# Patient Record
Sex: Female | Born: 1945 | Race: White | Hispanic: No | Marital: Married | Smoking: Former smoker
Health system: Southern US, Community
[De-identification: ages and names within clinical notes are randomized; demographics above are authoritative.]

## PROBLEM LIST (undated history)

## (undated) DIAGNOSIS — S62667A Nondisplaced fracture of distal phalanx of left little finger, initial encounter for closed fracture: Secondary | ICD-10-CM

## (undated) DIAGNOSIS — S93325A Dislocation of tarsometatarsal joint of left foot, initial encounter: Secondary | ICD-10-CM

## (undated) DIAGNOSIS — I10 Essential (primary) hypertension: Secondary | ICD-10-CM

## (undated) DIAGNOSIS — C679 Malignant neoplasm of bladder, unspecified: Secondary | ICD-10-CM

## (undated) DIAGNOSIS — Z9289 Personal history of other medical treatment: Secondary | ICD-10-CM

## (undated) DIAGNOSIS — S92902A Unspecified fracture of left foot, initial encounter for closed fracture: Secondary | ICD-10-CM

## (undated) DIAGNOSIS — M810 Age-related osteoporosis without current pathological fracture: Secondary | ICD-10-CM

## (undated) DIAGNOSIS — F419 Anxiety disorder, unspecified: Secondary | ICD-10-CM

## (undated) DIAGNOSIS — E7800 Pure hypercholesterolemia, unspecified: Secondary | ICD-10-CM

## (undated) HISTORY — PX: APPENDECTOMY: SHX54

## (undated) HISTORY — PX: TRANSURETHRAL RESECTION OF BLADDER TUMOR: SHX2575

## (undated) HISTORY — PX: FRACTURE SURGERY: SHX138

## (undated) HISTORY — PX: RIGHT OOPHORECTOMY: SHX2359

## (undated) HISTORY — PX: ABDOMINAL HYSTERECTOMY: SHX81

---

## 2006-04-25 HISTORY — PX: ELBOW FRACTURE SURGERY: SHX616

## 2013-04-25 HISTORY — PX: FOOT FRACTURE SURGERY: SHX645

## 2015-07-25 HISTORY — PX: FIBULA FRACTURE SURGERY: SHX947

## 2017-04-25 DIAGNOSIS — Z9289 Personal history of other medical treatment: Secondary | ICD-10-CM

## 2017-04-25 HISTORY — DX: Personal history of other medical treatment: Z92.89

## 2017-05-08 ENCOUNTER — Emergency Department (HOSPITAL_COMMUNITY): Payer: Medicare Other

## 2017-05-08 ENCOUNTER — Inpatient Hospital Stay (HOSPITAL_COMMUNITY)
Admission: EM | Admit: 2017-05-08 | Discharge: 2017-05-22 | DRG: 460 | Disposition: A | Discharge status: Home Health | Payer: Medicare Other | Attending: General Surgery | Admitting: General Surgery

## 2017-05-08 ENCOUNTER — Encounter (HOSPITAL_COMMUNITY): Payer: Self-pay | Admitting: Emergency Medicine

## 2017-05-08 DIAGNOSIS — E8889 Other specified metabolic disorders: Secondary | ICD-10-CM | POA: Diagnosis present

## 2017-05-08 DIAGNOSIS — S62637A Displaced fracture of distal phalanx of left little finger, initial encounter for closed fracture: Secondary | ICD-10-CM | POA: Diagnosis present

## 2017-05-08 DIAGNOSIS — S32018A Other fracture of first lumbar vertebra, initial encounter for closed fracture: Principal | ICD-10-CM | POA: Diagnosis present

## 2017-05-08 DIAGNOSIS — S93325A Dislocation of tarsometatarsal joint of left foot, initial encounter: Secondary | ICD-10-CM | POA: Diagnosis present

## 2017-05-08 DIAGNOSIS — S62667A Nondisplaced fracture of distal phalanx of left little finger, initial encounter for closed fracture: Secondary | ICD-10-CM

## 2017-05-08 DIAGNOSIS — S92332A Displaced fracture of third metatarsal bone, left foot, initial encounter for closed fracture: Secondary | ICD-10-CM | POA: Diagnosis present

## 2017-05-08 DIAGNOSIS — S36039A Unspecified laceration of spleen, initial encounter: Secondary | ICD-10-CM | POA: Diagnosis present

## 2017-05-08 DIAGNOSIS — S92342A Displaced fracture of fourth metatarsal bone, left foot, initial encounter for closed fracture: Secondary | ICD-10-CM | POA: Diagnosis present

## 2017-05-08 DIAGNOSIS — S92352A Displaced fracture of fifth metatarsal bone, left foot, initial encounter for closed fracture: Secondary | ICD-10-CM | POA: Diagnosis present

## 2017-05-08 DIAGNOSIS — F419 Anxiety disorder, unspecified: Secondary | ICD-10-CM | POA: Diagnosis present

## 2017-05-08 DIAGNOSIS — K661 Hemoperitoneum: Secondary | ICD-10-CM

## 2017-05-08 DIAGNOSIS — E876 Hypokalemia: Secondary | ICD-10-CM | POA: Diagnosis present

## 2017-05-08 DIAGNOSIS — M810 Age-related osteoporosis without current pathological fracture: Secondary | ICD-10-CM | POA: Diagnosis present

## 2017-05-08 DIAGNOSIS — S92322A Displaced fracture of second metatarsal bone, left foot, initial encounter for closed fracture: Secondary | ICD-10-CM | POA: Diagnosis present

## 2017-05-08 DIAGNOSIS — J9811 Atelectasis: Secondary | ICD-10-CM | POA: Diagnosis present

## 2017-05-08 DIAGNOSIS — R200 Anesthesia of skin: Secondary | ICD-10-CM | POA: Diagnosis present

## 2017-05-08 DIAGNOSIS — S92312A Displaced fracture of first metatarsal bone, left foot, initial encounter for closed fracture: Secondary | ICD-10-CM | POA: Diagnosis present

## 2017-05-08 DIAGNOSIS — S36030A Superficial (capsular) laceration of spleen, initial encounter: Secondary | ICD-10-CM | POA: Diagnosis present

## 2017-05-08 DIAGNOSIS — R339 Retention of urine, unspecified: Secondary | ICD-10-CM | POA: Diagnosis present

## 2017-05-08 DIAGNOSIS — S92812A Other fracture of left foot, initial encounter for closed fracture: Secondary | ICD-10-CM

## 2017-05-08 DIAGNOSIS — Z79899 Other long term (current) drug therapy: Secondary | ICD-10-CM

## 2017-05-08 DIAGNOSIS — S2220XA Unspecified fracture of sternum, initial encounter for closed fracture: Secondary | ICD-10-CM | POA: Diagnosis present

## 2017-05-08 DIAGNOSIS — I10 Essential (primary) hypertension: Secondary | ICD-10-CM | POA: Diagnosis present

## 2017-05-08 DIAGNOSIS — S2243XA Multiple fractures of ribs, bilateral, initial encounter for closed fracture: Secondary | ICD-10-CM | POA: Diagnosis present

## 2017-05-08 DIAGNOSIS — M79673 Pain in unspecified foot: Secondary | ICD-10-CM

## 2017-05-08 DIAGNOSIS — S36892A Contusion of other intra-abdominal organs, initial encounter: Secondary | ICD-10-CM | POA: Diagnosis present

## 2017-05-08 DIAGNOSIS — R6889 Other general symptoms and signs: Secondary | ICD-10-CM

## 2017-05-08 DIAGNOSIS — Z87891 Personal history of nicotine dependence: Secondary | ICD-10-CM

## 2017-05-08 DIAGNOSIS — M79642 Pain in left hand: Secondary | ICD-10-CM | POA: Diagnosis present

## 2017-05-08 DIAGNOSIS — Z7983 Long term (current) use of bisphosphonates: Secondary | ICD-10-CM

## 2017-05-08 DIAGNOSIS — S36114A Minor laceration of liver, initial encounter: Secondary | ICD-10-CM | POA: Diagnosis present

## 2017-05-08 DIAGNOSIS — Y92410 Unspecified street and highway as the place of occurrence of the external cause: Secondary | ICD-10-CM

## 2017-05-08 DIAGNOSIS — S2242XA Multiple fractures of ribs, left side, initial encounter for closed fracture: Secondary | ICD-10-CM

## 2017-05-08 DIAGNOSIS — S32001A Stable burst fracture of unspecified lumbar vertebra, initial encounter for closed fracture: Secondary | ICD-10-CM | POA: Diagnosis present

## 2017-05-08 DIAGNOSIS — D62 Acute posthemorrhagic anemia: Secondary | ICD-10-CM | POA: Diagnosis not present

## 2017-05-08 DIAGNOSIS — S299XXA Unspecified injury of thorax, initial encounter: Secondary | ICD-10-CM

## 2017-05-08 DIAGNOSIS — M549 Dorsalgia, unspecified: Secondary | ICD-10-CM | POA: Diagnosis present

## 2017-05-08 DIAGNOSIS — T148XXA Other injury of unspecified body region, initial encounter: Secondary | ICD-10-CM

## 2017-05-08 DIAGNOSIS — S92902A Unspecified fracture of left foot, initial encounter for closed fracture: Secondary | ICD-10-CM | POA: Diagnosis present

## 2017-05-08 DIAGNOSIS — K5900 Constipation, unspecified: Secondary | ICD-10-CM | POA: Diagnosis not present

## 2017-05-08 DIAGNOSIS — Z419 Encounter for procedure for purposes other than remedying health state, unspecified: Secondary | ICD-10-CM

## 2017-05-08 HISTORY — DX: Unspecified fracture of left foot, initial encounter for closed fracture: S92.902A

## 2017-05-08 HISTORY — DX: Dislocation of tarsometatarsal joint of left foot, initial encounter: S93.325A

## 2017-05-08 HISTORY — DX: Personal history of other medical treatment: Z92.89

## 2017-05-08 HISTORY — DX: Anxiety disorder, unspecified: F41.9

## 2017-05-08 HISTORY — DX: Nondisplaced fracture of distal phalanx of left little finger, initial encounter for closed fracture: S62.667A

## 2017-05-08 HISTORY — DX: Malignant neoplasm of bladder, unspecified: C67.9

## 2017-05-08 HISTORY — DX: Age-related osteoporosis without current pathological fracture: M81.0

## 2017-05-08 HISTORY — DX: Pure hypercholesterolemia, unspecified: E78.00

## 2017-05-08 HISTORY — DX: Essential (primary) hypertension: I10

## 2017-05-08 LAB — ABO/RH: ABO/RH(D): AB POS

## 2017-05-08 LAB — COMPREHENSIVE METABOLIC PANEL
ALBUMIN: 3.3 g/dL — AB (ref 3.5–5.0)
ALT: 73 U/L — AB (ref 14–54)
ANION GAP: 12 (ref 5–15)
AST: 92 U/L — ABNORMAL HIGH (ref 15–41)
Alkaline Phosphatase: 56 U/L (ref 38–126)
BUN: 30 mg/dL — ABNORMAL HIGH (ref 6–20)
CHLORIDE: 101 mmol/L (ref 101–111)
CO2: 25 mmol/L (ref 22–32)
CREATININE: 1.05 mg/dL — AB (ref 0.44–1.00)
Calcium: 9.3 mg/dL (ref 8.9–10.3)
GFR calc non Af Amer: 52 mL/min — ABNORMAL LOW (ref >60)
GLUCOSE: 149 mg/dL — AB (ref 65–99)
Potassium: 3.2 mmol/L — ABNORMAL LOW (ref 3.5–5.1)
SODIUM: 138 mmol/L (ref 135–145)
Total Bilirubin: 0.8 mg/dL (ref 0.3–1.2)
Total Protein: 6.2 g/dL — ABNORMAL LOW (ref 6.5–8.1)

## 2017-05-08 LAB — CBC
HCT: 39.3 % (ref 36.0–46.0)
Hemoglobin: 13.4 g/dL (ref 12.0–15.0)
MCH: 30.8 pg (ref 26.0–34.0)
MCHC: 34.1 g/dL (ref 30.0–36.0)
MCV: 90.3 fL (ref 78.0–100.0)
Platelets: 232 10*3/uL (ref 150–400)
RBC: 4.35 MIL/uL (ref 3.87–5.11)
RDW: 13.1 % (ref 11.5–15.5)
WBC: 15.5 10*3/uL — ABNORMAL HIGH (ref 4.0–10.5)

## 2017-05-08 LAB — I-STAT TROPONIN, ED: TROPONIN I, POC: 0.01 ng/mL (ref 0.00–0.08)

## 2017-05-08 LAB — I-STAT CG4 LACTIC ACID, ED: Lactic Acid, Venous: 2.88 mmol/L (ref 0.5–1.9)

## 2017-05-08 LAB — PROTIME-INR
INR: 0.99
PROTHROMBIN TIME: 13 s (ref 11.4–15.2)

## 2017-05-08 LAB — I-STAT CHEM 8, ED
BUN: 34 mg/dL — ABNORMAL HIGH (ref 6–20)
CHLORIDE: 100 mmol/L — AB (ref 101–111)
Calcium, Ion: 1.22 mmol/L (ref 1.15–1.40)
Creatinine, Ser: 1.1 mg/dL — ABNORMAL HIGH (ref 0.44–1.00)
GLUCOSE: 150 mg/dL — AB (ref 65–99)
HCT: 39 % (ref 36.0–46.0)
HEMOGLOBIN: 13.3 g/dL (ref 12.0–15.0)
POTASSIUM: 3.4 mmol/L — AB (ref 3.5–5.1)
SODIUM: 143 mmol/L (ref 135–145)
TCO2: 29 mmol/L (ref 22–32)

## 2017-05-08 LAB — PREPARE RBC (CROSSMATCH)

## 2017-05-08 LAB — ETHANOL: Alcohol, Ethyl (B): <10 mg/dL (ref <10)

## 2017-05-08 MED ORDER — ONDANSETRON HCL 4 MG/2ML IJ SOLN
4.0000 mg | Freq: Once | INTRAMUSCULAR | Status: AC
  Administered 2017-05-08: 4 mg via INTRAVENOUS
  Filled 2017-05-08: qty 2

## 2017-05-08 MED ORDER — LORAZEPAM 2 MG/ML IJ SOLN
0.5000 mg | Freq: Once | INTRAMUSCULAR | Status: AC
  Administered 2017-05-09: 0.5 mg via INTRAVENOUS
  Filled 2017-05-08: qty 1

## 2017-05-08 MED ORDER — SODIUM CHLORIDE 0.9 % IV BOLUS (SEPSIS)
1000.0000 mL | Freq: Once | INTRAVENOUS | Status: AC
  Administered 2017-05-08: 1000 mL via INTRAVENOUS

## 2017-05-08 MED ORDER — MORPHINE SULFATE (PF) 4 MG/ML IV SOLN
1.0000 mg | INTRAVENOUS | Status: DC | PRN
  Administered 2017-05-09 – 2017-05-10 (×4): 1 mg via INTRAVENOUS
  Filled 2017-05-08 (×4): qty 1

## 2017-05-08 MED ORDER — SODIUM CHLORIDE 0.9 % IV SOLN
10.0000 mL/h | Freq: Once | INTRAVENOUS | Status: AC
  Administered 2017-05-08: 10 mL/h via INTRAVENOUS

## 2017-05-08 MED ORDER — ONDANSETRON 4 MG PO TBDP: 4.0000 mg | ORAL_TABLET | Freq: Four times a day (QID) | ORAL | Status: DC | PRN

## 2017-05-08 MED ORDER — KETAMINE HCL-SODIUM CHLORIDE 100-0.9 MG/10ML-% IV SOSY
PREFILLED_SYRINGE | INTRAVENOUS | Status: AC
  Administered 2017-05-08: 60 mg
  Filled 2017-05-08: qty 10

## 2017-05-08 MED ORDER — ONDANSETRON HCL 4 MG/2ML IJ SOLN
4.0000 mg | Freq: Once | INTRAMUSCULAR | Status: AC
  Administered 2017-05-08: 4 mg via INTRAVENOUS

## 2017-05-08 MED ORDER — DEXTROSE-NACL 5-0.9 % IV SOLN
INTRAVENOUS | Status: DC
  Administered 2017-05-08: 23:00:00 via INTRAVENOUS

## 2017-05-08 MED ORDER — IOPAMIDOL (ISOVUE-300) INJECTION 61%
INTRAVENOUS | Status: AC
  Administered 2017-05-08: 100 mL
  Filled 2017-05-08: qty 100

## 2017-05-08 MED ORDER — HYDRALAZINE HCL 20 MG/ML IJ SOLN: 10.0000 mg | INTRAMUSCULAR | Status: DC | PRN

## 2017-05-08 MED ORDER — ONDANSETRON HCL 4 MG/2ML IJ SOLN
INTRAMUSCULAR | Status: AC
  Filled 2017-05-08: qty 2

## 2017-05-08 MED ORDER — ONDANSETRON HCL 4 MG/2ML IJ SOLN: 4.0000 mg | Freq: Four times a day (QID) | INTRAMUSCULAR | Status: DC | PRN

## 2017-05-08 MED ORDER — FENTANYL CITRATE (PF) 100 MCG/2ML IJ SOLN
50.0000 ug | INTRAMUSCULAR | Status: DC | PRN
  Administered 2017-05-08 – 2017-05-09 (×5): 50 ug via INTRAVENOUS
  Filled 2017-05-08 (×5): qty 2
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
## 2017-05-08 NOTE — ED Notes (Signed)
[facility] called and unable to give update on why sample has not resulted from an hour ago.

## 2017-05-08 NOTE — ED Notes (Signed)
Patient placed on PureWick female external catheter. 

## 2017-05-08 NOTE — Consult Note (Signed)
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 second metatarsal distal diaphysis fracture. Electronically Signed   By: Kristine Garbe M.D.   On: 05/08/2017 20:08    ROS  ROS: I have reviewed the patient's review of systems thoroughly and there are no positive responses as relates to the HPI. Blood pressure (!) 97/48, pulse 81, temperature 98.1 F (36.7 C), temperature source Oral, resp. rate (!) 25, height 5' 5"  (1.651 m), weight 59 kg (130 lb), SpO2 96 %. Physical Exam Well-developed well-nourished patient in no acute distress. Alert and oriented x3 HEENT:within normal limits Cardiac: Regular rate and rhythm Pulmonary: Lungs clear to auscultation Abdomen: Soft and nontender.  Normal active bowel sounds  Musculoskeletal: (left hand has significant ecchymosis over the fifth metacarpal phalangeal joint area.  Left little finger is minimally tender to palpation around the DIP joint left little finger.  Left foot has obvious and significant soft tissue swelling with mild pain to range of motion. Assessment/Plan: 72 year old female with multiple traumatic injuries who suffered fracture dislocation of her foot which was reduced by the emergency room physician.   She has a Lisfranc fracture dislocation as well as an avulsion from the anterior aspect of the tibia and ultimately will need reduction and fixation.  She has a minimally displaced fracture of the distal phalanx of the left little finger.  This has minimal tenderness.//I came into the emergency room to evaluate the patient and given her significant traumatic injuries I think we need to sort out what she is going to need to be done.  Specifically I think we need to know what she can have done for her lumbar spine fracture.  Ultimately I will likely hand her case off to the trauma service given that the timing of surgical intervention is unknown.  I have put a Xeroform dressing over her foot in case she has significant swelling and I put a compressive wrap around the foot to help control for swelling.  She will have this elevated and iced to help with swelling and fluid skin compromise.  Emily Brewer 05/08/2017, 11:03 PM
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
## 2017-05-08 NOTE — ED Notes (Signed)
Family at beside. Family given emotional support. 

## 2017-05-08 NOTE — ED Notes (Signed)
Pt vomiting.

## 2017-05-08 NOTE — ED Notes (Signed)
Neuro at the bedside.
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
## 2017-05-08 NOTE — ED Notes (Signed)
Family updated as to patient's status.

## 2017-05-09 ENCOUNTER — Inpatient Hospital Stay (HOSPITAL_COMMUNITY): Payer: Medicare Other

## 2017-05-09 LAB — COMPREHENSIVE METABOLIC PANEL
ALBUMIN: 2.9 g/dL — AB (ref 3.5–5.0)
ALK PHOS: 38 U/L (ref 38–126)
ALT: 68 U/L — ABNORMAL HIGH (ref 14–54)
AST: 104 U/L — ABNORMAL HIGH (ref 15–41)
Anion gap: 11 (ref 5–15)
BILIRUBIN TOTAL: 0.9 mg/dL (ref 0.3–1.2)
BUN: 24 mg/dL — ABNORMAL HIGH (ref 6–20)
CALCIUM: 7.8 mg/dL — AB (ref 8.9–10.3)
CO2: 17 mmol/L — ABNORMAL LOW (ref 22–32)
Chloride: 112 mmol/L — ABNORMAL HIGH (ref 101–111)
Creatinine, Ser: 0.82 mg/dL (ref 0.44–1.00)
GFR calc Af Amer: >60 mL/min (ref >60)
GFR calc non Af Amer: >60 mL/min (ref >60)
GLUCOSE: 188 mg/dL — AB (ref 65–99)
Potassium: 3.2 mmol/L — ABNORMAL LOW (ref 3.5–5.1)
Sodium: 140 mmol/L (ref 135–145)
TOTAL PROTEIN: 5.1 g/dL — AB (ref 6.5–8.1)

## 2017-05-09 LAB — CBC
HEMATOCRIT: 35.7 % — AB (ref 36.0–46.0)
Hemoglobin: 12.1 g/dL (ref 12.0–15.0)
MCH: 30.3 pg (ref 26.0–34.0)
MCHC: 33.9 g/dL (ref 30.0–36.0)
MCV: 89.5 fL (ref 78.0–100.0)
Platelets: 125 10*3/uL — ABNORMAL LOW (ref 150–400)
RBC: 3.99 MIL/uL (ref 3.87–5.11)
RDW: 14.1 % (ref 11.5–15.5)
WBC: 10.8 10*3/uL — ABNORMAL HIGH (ref 4.0–10.5)

## 2017-05-09 LAB — SAMPLE TO BLOOD BANK

## 2017-05-09 LAB — URINALYSIS, ROUTINE W REFLEX MICROSCOPIC
Bacteria, UA: NONE SEEN
Bilirubin Urine: NEGATIVE
GLUCOSE, UA: 50 mg/dL — AB
Ketones, ur: 20 mg/dL — AB
Leukocytes, UA: NEGATIVE
NITRITE: NEGATIVE
Protein, ur: NEGATIVE mg/dL
Specific Gravity, Urine: >1.046 — ABNORMAL HIGH (ref 1.005–1.030)
pH: 5 (ref 5.0–8.0)

## 2017-05-09 LAB — MRSA PCR SCREENING: MRSA BY PCR: NEGATIVE

## 2017-05-09 MED ORDER — KCL IN DEXTROSE-NACL 20-5-0.45 MEQ/L-%-% IV SOLN
INTRAVENOUS | Status: DC
  Administered 2017-05-09 – 2017-05-11 (×5): via INTRAVENOUS
  Filled 2017-05-09 (×5): qty 1000

## 2017-05-09 MED ORDER — ORAL CARE MOUTH RINSE
15.0000 mL | Freq: Two times a day (BID) | OROMUCOSAL | Status: DC
  Administered 2017-05-09 – 2017-05-21 (×22): 15 mL via OROMUCOSAL

## 2017-05-09 MED ORDER — POTASSIUM CHLORIDE 10 MEQ/100ML IV SOLN
10.0000 meq | INTRAVENOUS | Status: AC
  Administered 2017-05-09 (×2): 10 meq via INTRAVENOUS
  Filled 2017-05-09 (×2): qty 100

## 2017-05-09 NOTE — ED Notes (Signed)
Pt bladder scanned and no volume shown. Had Kahi Mohala RN check as well and no volume verified. Pt has has no Urine output at this time. Will continue to monitor.
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
## 2017-05-09 NOTE — ED Notes (Signed)
Pt was given 60 mg of Ketamine by Corrie Dandy. MD Long reduced L foot. Pt was placed on Capnography and watched by Myriam Jacobson RN until Pt able to move all 4 extremities and answer LOC questions.   Q 5 min vitals were obtained until Pt came around.
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
## 2017-05-09 NOTE — Progress Notes (Signed)
Pt safely tranfered from MRI to [id number] - remains A/Ox4 and moves all extremities. Results of MRI written via radiologist and called to neurosurgery by RN based on note from MD who assessed the pt in the ED. (unstable L1 fx)  RN informed to keep flat and log-roll only for L1 fx and based on results of cervical spine CT - informed that c-collar could remain off (pt did not have one on upon arrival to ICU).   Will continue to monitor closely.  Guadalupe Maple, RN

## 2017-05-09 NOTE — Progress Notes (Signed)
Orthopedic Tech Progress Note Patient Details:  Emily Brewer [birth year] [id number]  Ortho Devices Type of Ortho Device: Finger splint Ortho Device/Splint Location: LUE pinky Ortho Device/Splint Interventions: Ordered, Application   Post Interventions Patient Tolerated: Well Instructions Provided: Care of device   Braulio Bosch 05/09/2017, 6:25 PM

## 2017-05-09 NOTE — Progress Notes (Signed)
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 urinary retention, strict I&O and unstable L-1 fracture Keep NPO for now until we can re-examine her abdomen to rule out need for exploratory laparotomy  Correct hypokalemia  LOS: 1 day   Kathryne Eriksson. Dahlia Bailiff, MD, FACS [phone] Trauma Surgeon 05/09/2017
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
## 2017-05-10 ENCOUNTER — Encounter (HOSPITAL_COMMUNITY): Payer: Self-pay | Admitting: *Deleted

## 2017-05-10 ENCOUNTER — Inpatient Hospital Stay (HOSPITAL_COMMUNITY): Payer: Medicare Other

## 2017-05-10 DIAGNOSIS — S93325A Dislocation of tarsometatarsal joint of left foot, initial encounter: Secondary | ICD-10-CM | POA: Diagnosis present

## 2017-05-10 DIAGNOSIS — S62667A Nondisplaced fracture of distal phalanx of left little finger, initial encounter for closed fracture: Secondary | ICD-10-CM

## 2017-05-10 DIAGNOSIS — S92902A Unspecified fracture of left foot, initial encounter for closed fracture: Secondary | ICD-10-CM

## 2017-05-10 HISTORY — DX: Unspecified fracture of left foot, initial encounter for closed fracture: S92.902A

## 2017-05-10 HISTORY — PX: OPEN REDUCTION INTERNAL FIXATION (ORIF) FOOT LISFRANC FRACTURE: SHX5990

## 2017-05-10 HISTORY — DX: Nondisplaced fracture of distal phalanx of left little finger, initial encounter for closed fracture: S62.667A

## 2017-05-10 HISTORY — DX: Dislocation of tarsometatarsal joint of left foot, initial encounter: S93.325A

## 2017-05-10 LAB — BASIC METABOLIC PANEL
ANION GAP: 7 (ref 5–15)
BUN: 13 mg/dL (ref 6–20)
CALCIUM: 7.7 mg/dL — AB (ref 8.9–10.3)
CO2: 23 mmol/L (ref 22–32)
Chloride: 110 mmol/L (ref 101–111)
Creatinine, Ser: 0.79 mg/dL (ref 0.44–1.00)
GLUCOSE: 180 mg/dL — AB (ref 65–99)
Potassium: 3.6 mmol/L (ref 3.5–5.1)
SODIUM: 140 mmol/L (ref 135–145)

## 2017-05-10 LAB — TYPE AND SCREEN
ABO/RH(D): AB POS
Antibody Screen: NEGATIVE
UNIT DIVISION: 0
UNIT DIVISION: 0

## 2017-05-10 LAB — CBC WITH DIFFERENTIAL/PLATELET
Basophils Absolute: 0 10*3/uL (ref 0.0–0.1)
Basophils Relative: 0 %
Eosinophils Absolute: 0.1 10*3/uL (ref 0.0–0.7)
Eosinophils Relative: 1 %
HCT: 35.6 % — ABNORMAL LOW (ref 36.0–46.0)
Hemoglobin: 12.1 g/dL (ref 12.0–15.0)
Lymphocytes Relative: 10 %
Lymphs Abs: 0.9 10*3/uL (ref 0.7–4.0)
MCH: 30.4 pg (ref 26.0–34.0)
MCHC: 34 g/dL (ref 30.0–36.0)
MCV: 89.4 fL (ref 78.0–100.0)
Monocytes Absolute: 0.3 10*3/uL (ref 0.1–1.0)
Monocytes Relative: 4 %
Neutro Abs: 7.3 10*3/uL (ref 1.7–7.7)
Neutrophils Relative %: 85 %
Platelets: 109 10*3/uL — ABNORMAL LOW (ref 150–400)
RBC: 3.98 MIL/uL (ref 3.87–5.11)
RDW: 14.3 % (ref 11.5–15.5)
WBC: 8.6 10*3/uL (ref 4.0–10.5)

## 2017-05-10 LAB — BPAM RBC
BLOOD PRODUCT EXPIRATION DATE: 201902182359
Blood Product Expiration Date: 201902182359
ISSUE DATE / TIME: 201901150034
ISSUE DATE / TIME: 201901142230
UNIT TYPE AND RH: 8400
Unit Type and Rh: 8400

## 2017-05-10 MED ORDER — HYDROMORPHONE HCL 1 MG/ML IJ SOLN
0.5000 mg | INTRAMUSCULAR | Status: DC | PRN
  Administered 2017-05-18: 0.5 mg via INTRAVENOUS
  Filled 2017-05-10: qty 0.5

## 2017-05-10 MED ORDER — GABAPENTIN 300 MG PO CAPS
300.0000 mg | ORAL_CAPSULE | Freq: Three times a day (TID) | ORAL | Status: DC
  Administered 2017-05-10 – 2017-05-22 (×35): 300 mg via ORAL
  Filled 2017-05-10 (×36): qty 1

## 2017-05-10 MED ORDER — OXYCODONE HCL 5 MG PO TABS
10.0000 mg | ORAL_TABLET | Freq: Four times a day (QID) | ORAL | Status: DC | PRN
  Administered 2017-05-10 – 2017-05-21 (×19): 10 mg via ORAL
  Filled 2017-05-10 (×19): qty 2

## 2017-05-10 MED ORDER — ACETAMINOPHEN 325 MG PO TABS
650.0000 mg | ORAL_TABLET | Freq: Four times a day (QID) | ORAL | Status: DC
  Administered 2017-05-10 – 2017-05-22 (×47): 650 mg via ORAL
  Filled 2017-05-10 (×47): qty 2
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
## 2017-05-11 ENCOUNTER — Encounter (HOSPITAL_COMMUNITY): Payer: Self-pay | Admitting: General Practice

## 2017-05-11 ENCOUNTER — Other Ambulatory Visit: Payer: Self-pay

## 2017-05-11 MED ORDER — ATENOLOL-CHLORTHALIDONE 50-25 MG PO TABS: 0.5000 | ORAL_TABLET | Freq: Every day | ORAL | Status: DC

## 2017-05-11 MED ORDER — KCL IN DEXTROSE-NACL 20-5-0.45 MEQ/L-%-% IV SOLN
INTRAVENOUS | Status: DC
  Administered 2017-05-12 – 2017-05-14 (×3): via INTRAVENOUS
  Filled 2017-05-11 (×3): qty 1000

## 2017-05-11 MED ORDER — CHLORTHALIDONE 25 MG PO TABS
12.5000 mg | ORAL_TABLET | Freq: Every day | ORAL | Status: DC
  Administered 2017-05-11 – 2017-05-22 (×7): 12.5 mg via ORAL
  Filled 2017-05-11 (×10): qty 1

## 2017-05-11 MED ORDER — SIMVASTATIN 40 MG PO TABS
40.0000 mg | ORAL_TABLET | Freq: Every day | ORAL | Status: DC
  Administered 2017-05-11 – 2017-05-21 (×11): 40 mg via ORAL
  Filled 2017-05-11 (×11): qty 1

## 2017-05-11 MED ORDER — ATENOLOL 25 MG PO TABS
25.0000 mg | ORAL_TABLET | Freq: Every day | ORAL | Status: DC
  Administered 2017-05-11 – 2017-05-22 (×7): 25 mg via ORAL
  Filled 2017-05-11 (×10): qty 1
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
## 2017-05-12 MED ORDER — HEPARIN SODIUM (PORCINE) 5000 UNIT/ML IJ SOLN
5000.0000 [IU] | Freq: Three times a day (TID) | INTRAMUSCULAR | Status: AC
  Administered 2017-05-12 – 2017-05-16 (×14): 5000 [IU] via SUBCUTANEOUS
  Filled 2017-05-12 (×14): qty 1

## 2017-05-12 NOTE — Progress Notes (Signed)
NEUROSURGERY PROGRESS NOTE  Doing well. Complains of appropriate back soreness. No pain NT or W down legs.   Temp:  [97.7 F (36.5 C)-99 F (37.2 C)] 98.3 F (36.8 C) (01/18 0827) Pulse Rate:  [69-107] 73 (01/18 0400) Resp:  [11-28] 18 (01/18 0400) BP: (101-158)/(47-72) 118/63 (01/18 0400) SpO2:  [87 %-98 %] 97 % (01/18 0400)  Plan: Continue pain management. Hopeful surgery next week.   Eleonore Chiquito, NP 05/12/2017 10:00 AM
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
## 2017-05-13 NOTE — Progress Notes (Signed)
Trauma Service Note  Subjective: Patient and her family would like more definitive idea about when she may have surgery.  Objective: Vital signs in last 24 hours: Temp:  [98.1 F (36.7 C)-98.8 F (37.1 C)] 98.8 F (37.1 C) (01/19 1200) Pulse Rate:  [72-90] 74 (01/19 1200) Resp:  [9-24] 14 (01/19 1200) BP: (101-150)/(51-86) 142/56 (01/19 1200) SpO2:  [92 %-99 %] 94 % (01/19 1219) Last BM Date: (PTA)  Intake/Output from previous day: 01/18 0701 - 01/19 0700 In: 1334.6 [P.O.:570; I.V.:764.6] Out: 2150 [Urine:2150] Intake/Output this shift: Total I/O In: 320 [P.O.:120; I.V.:200] Out: 275 [Urine:275]  General: No distress.  Still at bedrest  Lungs: Clear.  Oxygen saturations 93% on room air  Abd: Soft, good bowel sounds.  No bowel movement documented  Extremities: No changes  Neuro: Intact  Lab Results: CBC  No results for input(s): WBC, HGB, HCT, PLT in the last 72 hours. BMET No results for input(s): NA, K, CL, CO2, GLUCOSE, BUN, CREATININE, CALCIUM in the last 72 hours. PT/INR No results for input(s): LABPROT, INR in the last 72 hours. ABG No results for input(s): PHART, HCO3 in the last 72 hours.  Invalid input(s): PCO2, PO2  Studies/Results: No results found.  Anti-infectives: Anti-infectives (From admission, onward)   None      Assessment/Plan: s/p Procedure(s): OPEN REDUCTION INTERNAL FIXATION (ORIF) FOOT LISFRANC FRACTURE Awaiting surgery this coming week on L-1 Chance fracture  LOS: 5 days   Emily Brewer. Dahlia Bailiff, MD, FACS [phone] Trauma Surgeon 05/13/2017

## 2017-05-13 NOTE — Progress Notes (Signed)
Orthopedic Tech Progress Note Patient Details:  Emily Brewer 1945-09-13 [id number]  Ortho Devices Type of Ortho Device: Ace wrap, Doran Durand splint, Post (short leg) splint Ortho Device/Splint Location: lle Ortho Device/Splint Interventions: Application   Post Interventions Patient Tolerated: Well Instructions Provided: Care of device   Hildred Priest 05/13/2017, 2:27 PM

## 2017-05-13 NOTE — Progress Notes (Signed)
Ortho tech to room to f/u on order regarding bulky jones splint LLE, PA paged making sure splint has not already been changed.  Ortho tech to come change splint. Continue to monitor patient.

## 2017-05-13 NOTE — Progress Notes (Signed)
Patient ID: Emily Brewer, female   DOB: 25-Oct-1945, 72 y.o.   MRN: [id number] Vital signs are stable Motor function remains intact in lower extremities Numbness and right anterior thigh seems to be resolved Patient awaiting decision regarding surgery Continues bedrest

## 2017-05-14 MED ORDER — DOCUSATE SODIUM 100 MG PO CAPS
100.0000 mg | ORAL_CAPSULE | Freq: Two times a day (BID) | ORAL | Status: DC
  Administered 2017-05-14 – 2017-05-21 (×13): 100 mg via ORAL
  Filled 2017-05-14 (×14): qty 1
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
## 2017-05-15 LAB — CBC
HCT: 35.7 % — ABNORMAL LOW (ref 36.0–46.0)
Hemoglobin: 11.7 g/dL — ABNORMAL LOW (ref 12.0–15.0)
MCH: 30.1 pg (ref 26.0–34.0)
MCHC: 32.8 g/dL (ref 30.0–36.0)
MCV: 91.8 fL (ref 78.0–100.0)
PLATELETS: 309 10*3/uL (ref 150–400)
RBC: 3.89 MIL/uL (ref 3.87–5.11)
RDW: 14.6 % (ref 11.5–15.5)
WBC: 9.5 10*3/uL (ref 4.0–10.5)

## 2017-05-15 LAB — BASIC METABOLIC PANEL
Anion gap: 10 (ref 5–15)
BUN: 13 mg/dL (ref 6–20)
CO2: 27 mmol/L (ref 22–32)
Calcium: 8.5 mg/dL — ABNORMAL LOW (ref 8.9–10.3)
Chloride: 102 mmol/L (ref 101–111)
Creatinine, Ser: 0.86 mg/dL (ref 0.44–1.00)
GFR calc Af Amer: >60 mL/min (ref >60)
Glucose, Bld: 134 mg/dL — ABNORMAL HIGH (ref 65–99)
POTASSIUM: 3.7 mmol/L (ref 3.5–5.1)
SODIUM: 139 mmol/L (ref 135–145)

## 2017-05-15 MED ORDER — POLYETHYLENE GLYCOL 3350 17 G PO PACK
17.0000 g | PACK | Freq: Every day | ORAL | Status: DC
  Administered 2017-05-15 – 2017-05-22 (×4): 17 g via ORAL
  Filled 2017-05-15 (×5): qty 1

## 2017-05-15 MED ORDER — CEFAZOLIN SODIUM-DEXTROSE 2-4 GM/100ML-% IV SOLN
2.0000 g | INTRAVENOUS | Status: AC
  Administered 2017-05-16: 2 g via INTRAVENOUS
  Filled 2017-05-15: qty 100

## 2017-05-15 MED ORDER — BISACODYL 10 MG RE SUPP
10.0000 mg | Freq: Every day | RECTAL | Status: DC | PRN
  Administered 2017-05-16: 10 mg via RECTAL
  Filled 2017-05-15: qty 1
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
## 2017-05-15 NOTE — Progress Notes (Signed)
Pt C/O constipation. A&Ox3. Pt has abd pain, abd is distented with decreased bowel sound. Pt is passing flatus and denied nausea. Passed on to day RN to pt's issue of constipation with doctors.

## 2017-05-15 NOTE — Progress Notes (Signed)
NEUROSURGERY PROGRESS NOTE  Doing well. States that her back pain has improved some. Denies NTW or pain down legs  Temp:  [98.3 F (36.8 C)-99 F (37.2 C)] 98.3 F (36.8 C) (01/21 0701) Pulse Rate:  [73-82] 73 (01/21 0701) Resp:  [15-21] 17 (01/21 0701) BP: (116-141)/(58-71) 125/62 (01/21 0701) SpO2:  [92 %-96 %] 96 % (01/21 0701)  Plan: Continue to lay flat. Pain management  Eleonore Chiquito, NP 05/15/2017 7:51 AM

## 2017-05-15 NOTE — Plan of Care (Signed)
  Health Behavior/Discharge Planning: Ability to manage health-related needs will improve 05/15/2017 0831 - Progressing by Irish Lack, RN   Pain Managment: General experience of comfort will improve 05/15/2017 0831 - Progressing by Irish Lack, RN

## 2017-05-15 NOTE — Plan of Care (Signed)
  Elimination: Will not experience complications related to bowel motility 05/15/2017 0831 - Not Progressing by Dominick Zertuche, Pt C/O constipation. +abd distention, firmness, and abd pain. Pt    Nausea and she is passing flatus. Last BP 05-07-17. Pt is bedrest    And takes oxycodone  and tylenol for pain.
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
## 2017-05-16 ENCOUNTER — Inpatient Hospital Stay (HOSPITAL_COMMUNITY): Payer: Medicare Other

## 2017-05-16 ENCOUNTER — Inpatient Hospital Stay (HOSPITAL_COMMUNITY): Payer: Medicare Other | Admitting: Certified Registered"

## 2017-05-16 ENCOUNTER — Other Ambulatory Visit: Payer: Self-pay | Admitting: Neurosurgery

## 2017-05-16 ENCOUNTER — Encounter (HOSPITAL_COMMUNITY): Admission: EM | Disposition: A | Discharge status: Home Health | Payer: Self-pay | Source: Home / Self Care

## 2017-05-16 HISTORY — PX: OPEN REDUCTION INTERNAL FIXATION (ORIF) FOOT LISFRANC FRACTURE: SHX5990

## 2017-05-16 SURGERY — OPEN REDUCTION INTERNAL FIXATION (ORIF) FOOT LISFRANC FRACTURE
Anesthesia: Regional | Site: Foot | Laterality: Left

## 2017-05-16 MED ORDER — LACTATED RINGERS IV SOLN
INTRAVENOUS | Status: DC
  Administered 2017-05-16 (×2): via INTRAVENOUS

## 2017-05-16 MED ORDER — PROPOFOL 10 MG/ML IV BOLUS
INTRAVENOUS | Status: AC
  Filled 2017-05-16: qty 20

## 2017-05-16 MED ORDER — ROCURONIUM BROMIDE 10 MG/ML (PF) SYRINGE
PREFILLED_SYRINGE | INTRAVENOUS | Status: DC | PRN
  Administered 2017-05-16: 40 mg via INTRAVENOUS
  Administered 2017-05-16: 20 mg via INTRAVENOUS

## 2017-05-16 MED ORDER — FENTANYL CITRATE (PF) 250 MCG/5ML IJ SOLN
INTRAMUSCULAR | Status: DC | PRN
  Administered 2017-05-16: 50 ug via INTRAVENOUS

## 2017-05-16 MED ORDER — FLEET ENEMA 7-19 GM/118ML RE ENEM
1.0000 | ENEMA | Freq: Once | RECTAL | Status: AC
  Administered 2017-05-16: 1 via RECTAL
  Filled 2017-05-16: qty 1

## 2017-05-16 MED ORDER — PHENYLEPHRINE HCL 10 MG/ML IJ SOLN
INTRAVENOUS | Status: DC | PRN
  Administered 2017-05-16: 20 ug/min via INTRAVENOUS

## 2017-05-16 MED ORDER — MIDAZOLAM HCL 2 MG/2ML IJ SOLN
INTRAMUSCULAR | Status: AC
  Filled 2017-05-16: qty 2

## 2017-05-16 MED ORDER — OXYCODONE HCL 5 MG PO TABS: 5.0000 mg | ORAL_TABLET | Freq: Once | ORAL | Status: DC | PRN

## 2017-05-16 MED ORDER — LIDOCAINE 2% (20 MG/ML) 5 ML SYRINGE
INTRAMUSCULAR | Status: DC | PRN
  Administered 2017-05-16: 40 mg via INTRAVENOUS

## 2017-05-16 MED ORDER — OXYCODONE HCL 5 MG/5ML PO SOLN: 5.0000 mg | Freq: Once | ORAL | Status: DC | PRN

## 2017-05-16 MED ORDER — ONDANSETRON HCL 4 MG/2ML IJ SOLN
INTRAMUSCULAR | Status: DC | PRN
  Administered 2017-05-16: 4 mg via INTRAVENOUS

## 2017-05-16 MED ORDER — CHLORHEXIDINE GLUCONATE CLOTH 2 % EX PADS
6.0000 | MEDICATED_PAD | Freq: Once | CUTANEOUS | Status: AC
  Administered 2017-05-16: 6 via TOPICAL

## 2017-05-16 MED ORDER — SUGAMMADEX SODIUM 200 MG/2ML IV SOLN
INTRAVENOUS | Status: DC | PRN
  Administered 2017-05-16: 126.4 mg via INTRAVENOUS

## 2017-05-16 MED ORDER — DEXAMETHASONE SODIUM PHOSPHATE 10 MG/ML IJ SOLN
10.0000 mg | INTRAMUSCULAR | Status: AC
  Administered 2017-05-17: 10 mg via INTRAVENOUS
  Filled 2017-05-16: qty 1

## 2017-05-16 MED ORDER — ROCURONIUM BROMIDE 10 MG/ML (PF) SYRINGE
PREFILLED_SYRINGE | INTRAVENOUS | Status: AC
  Filled 2017-05-16: qty 5

## 2017-05-16 MED ORDER — CEFAZOLIN SODIUM-DEXTROSE 2-4 GM/100ML-% IV SOLN
2.0000 g | INTRAVENOUS | Status: AC
  Administered 2017-05-17: 2 g via INTRAVENOUS
  Filled 2017-05-16 (×2): qty 100

## 2017-05-16 MED ORDER — DEXAMETHASONE SODIUM PHOSPHATE 10 MG/ML IJ SOLN
INTRAMUSCULAR | Status: DC | PRN
  Administered 2017-05-16: 5 mg via INTRAVENOUS

## 2017-05-16 MED ORDER — SUGAMMADEX SODIUM 200 MG/2ML IV SOLN
INTRAVENOUS | Status: AC
  Filled 2017-05-16: qty 2

## 2017-05-16 MED ORDER — 0.9 % SODIUM CHLORIDE (POUR BTL) OPTIME
TOPICAL | Status: DC | PRN
  Administered 2017-05-16: 1000 mL

## 2017-05-16 MED ORDER — CEFAZOLIN SODIUM-DEXTROSE 1-4 GM/50ML-% IV SOLN
1.0000 g | Freq: Three times a day (TID) | INTRAVENOUS | Status: AC
  Administered 2017-05-16 – 2017-05-17 (×3): 1 g via INTRAVENOUS
  Filled 2017-05-16 (×3): qty 50

## 2017-05-16 MED ORDER — ESMOLOL HCL 100 MG/10ML IV SOLN
INTRAVENOUS | Status: AC
  Filled 2017-05-16: qty 10

## 2017-05-16 MED ORDER — ACETAMINOPHEN 160 MG/5ML PO SOLN: 325.0000 mg | ORAL | Status: DC | PRN

## 2017-05-16 MED ORDER — ONDANSETRON HCL 4 MG/2ML IJ SOLN
INTRAMUSCULAR | Status: AC
  Filled 2017-05-16: qty 6

## 2017-05-16 MED ORDER — BUPIVACAINE-EPINEPHRINE (PF) 0.5% -1:200000 IJ SOLN
INTRAMUSCULAR | Status: DC | PRN
  Administered 2017-05-16: 30 mL via PERINEURAL

## 2017-05-16 MED ORDER — DEXAMETHASONE SODIUM PHOSPHATE 10 MG/ML IJ SOLN
INTRAMUSCULAR | Status: AC
  Filled 2017-05-16: qty 4

## 2017-05-16 MED ORDER — FENTANYL CITRATE (PF) 250 MCG/5ML IJ SOLN
INTRAMUSCULAR | Status: AC
  Filled 2017-05-16: qty 5

## 2017-05-16 MED ORDER — LIDOCAINE 2% (20 MG/ML) 5 ML SYRINGE
INTRAMUSCULAR | Status: AC
  Filled 2017-05-16: qty 5

## 2017-05-16 MED ORDER — ACETAMINOPHEN 325 MG PO TABS: 325.0000 mg | ORAL_TABLET | ORAL | Status: DC | PRN

## 2017-05-16 MED ORDER — BETHANECHOL CHLORIDE 10 MG PO TABS
10.0000 mg | ORAL_TABLET | Freq: Three times a day (TID) | ORAL | Status: DC
  Administered 2017-05-16 – 2017-05-22 (×17): 10 mg via ORAL
  Filled 2017-05-16 (×17): qty 1

## 2017-05-16 MED ORDER — PROPOFOL 10 MG/ML IV BOLUS
INTRAVENOUS | Status: DC | PRN
  Administered 2017-05-16: 100 mg via INTRAVENOUS

## 2017-05-16 MED ORDER — PHENYLEPHRINE 40 MCG/ML (10ML) SYRINGE FOR IV PUSH (FOR BLOOD PRESSURE SUPPORT)
PREFILLED_SYRINGE | INTRAVENOUS | Status: DC | PRN
  Administered 2017-05-16: 160 ug via INTRAVENOUS
  Administered 2017-05-16: 80 ug via INTRAVENOUS
  Administered 2017-05-16: 160 ug via INTRAVENOUS

## 2017-05-16 MED ORDER — MIDAZOLAM HCL 5 MG/5ML IJ SOLN
INTRAMUSCULAR | Status: DC | PRN
  Administered 2017-05-16 (×2): 1 mg via INTRAVENOUS

## 2017-05-16 MED ORDER — FENTANYL CITRATE (PF) 100 MCG/2ML IJ SOLN: 25.0000 ug | INTRAMUSCULAR | Status: DC | PRN

## 2017-05-16 SURGICAL SUPPLY — 66 items
BANDAGE ACE 4X5 VEL STRL LF (GAUZE/BANDAGES/DRESSINGS) ×6 IMPLANT
BANDAGE ACE 6X5 VEL STRL LF (GAUZE/BANDAGES/DRESSINGS) IMPLANT
BANDAGE ESMARK 6X9 LF (GAUZE/BANDAGES/DRESSINGS) ×1 IMPLANT
BNDG ESMARK 6X9 LF (GAUZE/BANDAGES/DRESSINGS) ×3
BNDG GAUZE ELAST 4 BULKY (GAUZE/BANDAGES/DRESSINGS) ×3 IMPLANT
BRUSH SCRUB SURG 4.25 DISP (MISCELLANEOUS) ×6 IMPLANT
COVER MAYO STAND STRL (DRAPES) ×3 IMPLANT
COVER SURGICAL LIGHT HANDLE (MISCELLANEOUS) ×3 IMPLANT
DRAPE C-ARM 42X72 X-RAY (DRAPES) ×3 IMPLANT
DRAPE C-ARMOR (DRAPES) ×3 IMPLANT
DRAPE HALF SHEET 40X57 (DRAPES) ×3 IMPLANT
DRAPE U-SHAPE 47X51 STRL (DRAPES) ×3 IMPLANT
DRSG ADAPTIC 3X8 NADH LF (GAUZE/BANDAGES/DRESSINGS) ×3 IMPLANT
DRSG EMULSION OIL 3X3 NADH (GAUZE/BANDAGES/DRESSINGS) IMPLANT
ELECT REM PT RETURN 9FT ADLT (ELECTROSURGICAL) ×3
ELECTRODE REM PT RTRN 9FT ADLT (ELECTROSURGICAL) ×1 IMPLANT
GAUZE SPONGE 4X4 12PLY STRL (GAUZE/BANDAGES/DRESSINGS) IMPLANT
GAUZE SPONGE 4X4 12PLY STRL LF (GAUZE/BANDAGES/DRESSINGS) ×3 IMPLANT
GLOVE BIO SURGEON STRL SZ7.5 (GLOVE) ×3 IMPLANT
GLOVE BIO SURGEON STRL SZ8 (GLOVE) ×3 IMPLANT
GLOVE BIOGEL PI IND STRL 6.5 (GLOVE) ×2 IMPLANT
GLOVE BIOGEL PI IND STRL 7.5 (GLOVE) ×1 IMPLANT
GLOVE BIOGEL PI IND STRL 8 (GLOVE) ×1 IMPLANT
GLOVE BIOGEL PI INDICATOR 6.5 (GLOVE) ×4
GLOVE BIOGEL PI INDICATOR 7.5 (GLOVE) ×2
GLOVE BIOGEL PI INDICATOR 8 (GLOVE) ×2
GOWN STRL REUS W/ TWL LRG LVL3 (GOWN DISPOSABLE) ×2 IMPLANT
GOWN STRL REUS W/ TWL XL LVL3 (GOWN DISPOSABLE) ×1 IMPLANT
GOWN STRL REUS W/TWL LRG LVL3 (GOWN DISPOSABLE) ×4
GOWN STRL REUS W/TWL XL LVL3 (GOWN DISPOSABLE) ×2
GUIDEWIRE NON THREAD 1.6MM (WIRE) ×3 IMPLANT
K-WIRE 1.6 (WIRE) ×2
K-WIRE FX150X1.6XTROC PNT (WIRE) ×1
K-WIRE PLA 9 .062 (WIRE) ×3 IMPLANT
K-WIRE TROCAR DB TIP 1.6X150 (Orthopedic Implant) ×6 IMPLANT
K-WIRE TROCAR DB TIP 2X150 (Orthopedic Implant) ×3 IMPLANT
KIT BASIN OR (CUSTOM PROCEDURE TRAY) ×3 IMPLANT
KIT ROOM TURNOVER OR (KITS) ×3 IMPLANT
KWIRE FX150X1.6XTROC PNT (WIRE) ×1 IMPLANT
KWIRE TROCAR DB TIP 1.6X150 (Orthopedic Implant) ×2 IMPLANT
KWIRE TROCAR DB TIP 2X150 (Orthopedic Implant) ×1 IMPLANT
MANIFOLD NEPTUNE II (INSTRUMENTS) ×3 IMPLANT
NEEDLE HYPO 21X1.5 SAFETY (NEEDLE) IMPLANT
NS IRRIG 1000ML POUR BTL (IV SOLUTION) ×3 IMPLANT
PACK GENERAL/GYN (CUSTOM PROCEDURE TRAY) IMPLANT
PACK ORTHO EXTREMITY (CUSTOM PROCEDURE TRAY) ×3 IMPLANT
PAD ABD 8X10 STRL (GAUZE/BANDAGES/DRESSINGS) ×3 IMPLANT
PAD ARMBOARD 7.5X6 YLW CONV (MISCELLANEOUS) ×6 IMPLANT
PAD CAST 4YDX4 CTTN HI CHSV (CAST SUPPLIES) ×1 IMPLANT
PADDING CAST COTTON 4X4 STRL (CAST SUPPLIES) ×2
PADDING CAST COTTON 6X4 STRL (CAST SUPPLIES) ×3 IMPLANT
PIN TROCAR POINT 2MM-1 (PIN) IMPLANT
SPONGE LAP 18X18 X RAY DECT (DISPOSABLE) ×3 IMPLANT
STAPLER VISISTAT 35W (STAPLE) IMPLANT
SUCTION FRAZIER HANDLE 10FR (MISCELLANEOUS) ×2
SUCTION TUBE FRAZIER 10FR DISP (MISCELLANEOUS) ×1 IMPLANT
SUT ETHILON 3 0 PS 1 (SUTURE) ×3 IMPLANT
SUT PDS AB 2-0 CT1 27 (SUTURE) IMPLANT
SUT VIC AB 2-0 CT1 27 (SUTURE) ×2
SUT VIC AB 2-0 CT1 TAPERPNT 27 (SUTURE) ×1 IMPLANT
TOWEL OR 17X24 6PK STRL BLUE (TOWEL DISPOSABLE) ×3 IMPLANT
TOWEL OR 17X26 10 PK STRL BLUE (TOWEL DISPOSABLE) ×6 IMPLANT
TUBE CONNECTING 12'X1/4 (SUCTIONS) ×1
TUBE CONNECTING 12X1/4 (SUCTIONS) ×2 IMPLANT
UNDERPAD 30X30 (UNDERPADS AND DIAPERS) ×3 IMPLANT
WATER STERILE IRR 1000ML POUR (IV SOLUTION) ×3 IMPLANT
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
## 2017-05-16 NOTE — Anesthesia Procedure Notes (Addendum)
Anesthesia Regional Block: Popliteal block   Pre-Anesthetic Checklist: ,, timeout performed, Correct Patient, Correct Site, Correct Laterality, Correct Procedure, Correct Position, site marked, Risks and benefits discussed,  Surgical consent,  Pre-op evaluation,  At surgeon's request and post-op pain management  Laterality: Lower and Left  Prep: chloraprep       Needles:  Injection technique: Single-shot  Needle Type: Echogenic Stimulator Needle          Additional Needles:   Procedures:,,,, ultrasound used (permanent image in chart),,,,  Narrative:  Start time: 05/16/2017 8:29 AM End time: 05/16/2017 8:34 AM Injection made incrementally with aspirations every 5 mL.  Performed by: Personally  Anesthesiologist: Oleta Mouse, MD  Additional Notes: H+P and labs reviewed, risks and benefits discussed with patient, procedure tolerated well without complications

## 2017-05-16 NOTE — Progress Notes (Signed)
Pt received from PACU @ 1147. Pt pleasant, A&O, VSS and not in any pain at this time.

## 2017-05-16 NOTE — Progress Notes (Signed)
Patient is unable to void and BS completed showed >727ml. Notified Dr Grandville Silos and received orders may in and out cath q 4 hours if unable to void.
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
## 2017-05-16 NOTE — Anesthesia Preprocedure Evaluation (Signed)
Anesthesia Evaluation  Patient identified by MRN, date of birth, ID band Patient awake    Reviewed: Allergy & Precautions, NPO status , Patient's Chart, lab work & pertinent test results, reviewed documented beta blocker date and time   History of Anesthesia Complications Negative for: history of anesthetic complications  Airway Mallampati: II  TM Distance: >3 FB Neck ROM: Full    Dental  (+) Teeth Intact   Pulmonary neg shortness of breath, neg sleep apnea, neg COPD, neg recent URI, former smoker,    breath sounds clear to auscultation       Cardiovascular hypertension, Pt. on medications and Pt. on home beta blockers (-) angina(-) Past MI and (-) CHF  Rhythm:Regular     Neuro/Psych PSYCHIATRIC DISORDERS Anxiety negative neurological ROS     GI/Hepatic negative GI ROS, Neg liver ROS,   Endo/Other  negative endocrine ROS  Renal/GU negative Renal ROS     Musculoskeletal negative musculoskeletal ROS (+)   Abdominal   Peds  Hematology negative hematology ROS (+)   Anesthesia Other Findings   Reproductive/Obstetrics                             Anesthesia Physical Anesthesia Plan  ASA: II  Anesthesia Plan: General and Regional   Post-op Pain Management:  Regional for Post-op pain   Induction: Intravenous  PONV Risk Score and Plan: 3 and Ondansetron and Dexamethasone  Airway Management Planned: LMA and Oral ETT  Additional Equipment: None  Intra-op Plan:   Post-operative Plan: Extubation in OR  Informed Consent: I have reviewed the patients History and Physical, chart, labs and discussed the procedure including the risks, benefits and alternatives for the proposed anesthesia with the patient or authorized representative who has indicated his/her understanding and acceptance.   Dental advisory given  Plan Discussed with: CRNA and Surgeon  Anesthesia Plan Comments:          Anesthesia Quick Evaluation
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
## 2017-05-16 NOTE — Anesthesia Procedure Notes (Signed)
Procedure Name: Intubation Date/Time: 05/16/2017 8:43 AM Performed by: Freddie Breech, CRNA Pre-anesthesia Checklist: Patient identified, Emergency Drugs available, Suction available and Patient being monitored Patient Re-evaluated:Patient Re-evaluated prior to induction Oxygen Delivery Method: Circle System Utilized Preoxygenation: Pre-oxygenation with 100% oxygen Induction Type: IV induction Ventilation: Mask ventilation without difficulty Laryngoscope Size: Mac and 3 Grade View: Grade I Tube type: Oral Tube size: 7.0 mm Number of attempts: 1 Airway Equipment and Method: Stylet and Oral airway Placement Confirmation: ETT inserted through vocal cords under direct vision,  positive ETCO2 and breath sounds checked- equal and bilateral Secured at: 21 cm Tube secured with: Tape Dental Injury: Teeth and Oropharynx as per pre-operative assessment

## 2017-05-16 NOTE — Brief Op Note (Signed)
05/16/2017  11:12 AM  PATIENT:  Emily Brewer  72 y.o. female  PRE-OPERATIVE DIAGNOSIS:   1. Left Lisfranc Fracture Dislocation 2. Left 2nd Metatarsal Fracture 3. Left 5th Metatarsal Fracture  POST-OPERATIVE DIAGNOSIS:   1. Left Lisfranc Fracture Dislocation 2. Left 2nd Metatarsal Fracture 3. Left 5th Metatarsal Fracture  PROCEDURE:  Procedure(s): 1. OPEN REDUCTION INTERNAL FIXATION (ORIF) FOOT LISFRANC FRACTURE (Left) 2. OPEN REDUCTION AND PINNING OF LEFT 2nd METATARSAL 3. CLOSED REDUCTION AND PINNING OF LEFT 5TH METATARSAL  SURGEON:  Surgeon(s) and Role:    Altamese Howard, MD - Primary  PHYSICIAN ASSISTANT: PA Student  ANESTHESIA:   general  EBL:  50 mL   BLOOD ADMINISTERED:none  DRAINS: none   LOCAL MEDICATIONS USED:  NONE  SPECIMEN:  No Specimen  DISPOSITION OF SPECIMEN:  N/A  COUNTS:  YES  TOURNIQUET:  * No tourniquets in log *  DICTATION: .Other Dictation: Dictation Number [id number]  PLAN OF CARE: Admit to inpatient   PATIENT DISPOSITION:  PACU - hemodynamically stable.   Delay start of Pharmacological VTE agent (>24hrs) due to surgical blood loss or risk of bleeding: no

## 2017-05-16 NOTE — Transfer of Care (Signed)
Immediate Anesthesia Transfer of Care Note  Patient: Emily Brewer  Procedure(s) Performed: OPEN REDUCTION INTERNAL FIXATION (ORIF) FOOT LISFRANC FRACTURE (Left Foot)  Patient Location: PACU  Anesthesia Type:GA combined with regional for post-op pain  Level of Consciousness: drowsy and patient cooperative  Airway & Oxygen Therapy: Patient Spontanous Breathing and Patient connected to face mask oxygen  Post-op Assessment: Report given to RN and Post -op Vital signs reviewed and stable  Post vital signs: Reviewed and stable  Last Vitals:  Vitals:   05/16/17 0300 05/16/17 1109  BP:    Pulse:    Resp:    Temp: 36.8 C (!) (P) 36.2 C  SpO2:      Last Pain:  Vitals:   05/16/17 1109  TempSrc:   PainSc: (P) 0-No pain      Patients Stated Pain Goal: 0 (83/15/17 6160)  Complications: No apparent anesthesia complications

## 2017-05-16 NOTE — Progress Notes (Signed)
Patient had a fleets with small success. Notified Dr Donne Hazel and received order for soap suds enema

## 2017-05-16 NOTE — Progress Notes (Signed)
Transport here to get patient for OR. Report given to Merrilyn Puma RN OR nurse. All questions answered. Pt wearing 2L [place] and transferred via bed.

## 2017-05-16 NOTE — Progress Notes (Signed)
No changes since yesterday!  I once more discussed with the patient the risks and benefits of surgery, including the possibility of infection, nerve injury, vessel injury, wound breakdown, arthritis, symptomatic hardware, DVT/ PE, loss of motion, malunion, nonunion, and need for further surgery among others.  She acknowledged these risks and wished to proceed.  Altamese Liverpool, MD [facility] [phone] (p)

## 2017-05-17 ENCOUNTER — Inpatient Hospital Stay (HOSPITAL_COMMUNITY): Payer: Medicare Other

## 2017-05-17 ENCOUNTER — Inpatient Hospital Stay (HOSPITAL_COMMUNITY): Payer: Medicare Other | Admitting: Anesthesiology

## 2017-05-17 ENCOUNTER — Inpatient Hospital Stay (HOSPITAL_COMMUNITY): Admission: EM | Disposition: A | Discharge status: Home Health | Payer: Self-pay | Source: Home / Self Care

## 2017-05-17 ENCOUNTER — Encounter (HOSPITAL_COMMUNITY): Payer: Self-pay | Admitting: Surgery

## 2017-05-17 DIAGNOSIS — S32001A Stable burst fracture of unspecified lumbar vertebra, initial encounter for closed fracture: Secondary | ICD-10-CM | POA: Diagnosis present

## 2017-05-17 SURGERY — POSTERIOR LUMBAR FUSION 1 LEVEL
Anesthesia: General | Site: Back

## 2017-05-17 MED ORDER — ROCURONIUM BROMIDE 100 MG/10ML IV SOLN
INTRAVENOUS | Status: DC | PRN
  Administered 2017-05-17 (×2): 50 mg via INTRAVENOUS

## 2017-05-17 MED ORDER — FENTANYL CITRATE (PF) 100 MCG/2ML IJ SOLN
INTRAMUSCULAR | Status: AC
  Filled 2017-05-17: qty 2

## 2017-05-17 MED ORDER — SODIUM CHLORIDE 0.9 % IR SOLN
Status: DC | PRN
  Administered 2017-05-17: 12:00:00

## 2017-05-17 MED ORDER — SUGAMMADEX SODIUM 200 MG/2ML IV SOLN
INTRAVENOUS | Status: DC | PRN
  Administered 2017-05-17: 200 mg via INTRAVENOUS

## 2017-05-17 MED ORDER — THROMBIN 20000 UNITS EX SOLR
CUTANEOUS | Status: AC
  Filled 2017-05-17: qty 20000

## 2017-05-17 MED ORDER — 0.9 % SODIUM CHLORIDE (POUR BTL) OPTIME
TOPICAL | Status: DC | PRN
  Administered 2017-05-17: 1000 mL

## 2017-05-17 MED ORDER — PROPOFOL 10 MG/ML IV BOLUS
INTRAVENOUS | Status: AC
  Filled 2017-05-17: qty 20

## 2017-05-17 MED ORDER — PROPOFOL 10 MG/ML IV BOLUS
INTRAVENOUS | Status: DC | PRN
  Administered 2017-05-17: 90 mg via INTRAVENOUS

## 2017-05-17 MED ORDER — LACTATED RINGERS IV SOLN
INTRAVENOUS | Status: DC | PRN
  Administered 2017-05-17: 12:00:00 via INTRAVENOUS

## 2017-05-17 MED ORDER — FENTANYL CITRATE (PF) 100 MCG/2ML IJ SOLN
25.0000 ug | INTRAMUSCULAR | Status: DC | PRN
  Administered 2017-05-17: 25 ug via INTRAVENOUS
  Administered 2017-05-17 (×2): 50 ug via INTRAVENOUS
  Administered 2017-05-17: 25 ug via INTRAVENOUS

## 2017-05-17 MED ORDER — VANCOMYCIN HCL 1000 MG IV SOLR
INTRAVENOUS | Status: AC
  Filled 2017-05-17: qty 1000

## 2017-05-17 MED ORDER — FENTANYL CITRATE (PF) 100 MCG/2ML IJ SOLN
INTRAMUSCULAR | Status: DC | PRN
  Administered 2017-05-17: 100 ug via INTRAVENOUS
  Administered 2017-05-17 (×2): 50 ug via INTRAVENOUS

## 2017-05-17 MED ORDER — PHENYLEPHRINE 40 MCG/ML (10ML) SYRINGE FOR IV PUSH (FOR BLOOD PRESSURE SUPPORT)
PREFILLED_SYRINGE | INTRAVENOUS | Status: DC | PRN
  Administered 2017-05-17: 80 ug via INTRAVENOUS

## 2017-05-17 MED ORDER — THROMBIN (RECOMBINANT) 20000 UNITS EX SOLR
CUTANEOUS | Status: DC | PRN
  Administered 2017-05-17: 12:00:00 via TOPICAL

## 2017-05-17 MED ORDER — LIDOCAINE-EPINEPHRINE 1 %-1:100000 IJ SOLN
INTRAMUSCULAR | Status: DC | PRN
  Administered 2017-05-17: 6 mL

## 2017-05-17 MED ORDER — ONDANSETRON HCL 4 MG/2ML IJ SOLN
INTRAMUSCULAR | Status: DC | PRN
  Administered 2017-05-17: 4 mg via INTRAVENOUS

## 2017-05-17 MED ORDER — VANCOMYCIN HCL 1000 MG IV SOLR
INTRAVENOUS | Status: DC | PRN
  Administered 2017-05-17: 1000 mg

## 2017-05-17 MED ORDER — FENTANYL CITRATE (PF) 100 MCG/2ML IJ SOLN
INTRAMUSCULAR | Status: AC
  Administered 2017-05-17: 25 ug via INTRAVENOUS
  Filled 2017-05-17: qty 2

## 2017-05-17 MED ORDER — LIDOCAINE HCL (CARDIAC) 20 MG/ML IV SOLN
INTRAVENOUS | Status: DC | PRN
  Administered 2017-05-17: 60 mg via INTRAVENOUS

## 2017-05-17 MED ORDER — ROCURONIUM BROMIDE 10 MG/ML (PF) SYRINGE
PREFILLED_SYRINGE | INTRAVENOUS | Status: AC
  Filled 2017-05-17: qty 5

## 2017-05-17 MED ORDER — FENTANYL CITRATE (PF) 250 MCG/5ML IJ SOLN
INTRAMUSCULAR | Status: AC
Start: 2017-05-17 — End: 2017-05-17
  Filled 2017-05-17: qty 5

## 2017-05-17 MED ORDER — ALBUMIN HUMAN 5 % IV SOLN
INTRAVENOUS | Status: DC | PRN
  Administered 2017-05-17: 13:00:00 via INTRAVENOUS

## 2017-05-17 MED ORDER — CEFAZOLIN SODIUM-DEXTROSE 1-4 GM/50ML-% IV SOLN
1.0000 g | Freq: Three times a day (TID) | INTRAVENOUS | Status: AC
  Administered 2017-05-17 – 2017-05-18 (×3): 1 g via INTRAVENOUS
  Filled 2017-05-17 (×3): qty 50

## 2017-05-17 MED ORDER — BUPIVACAINE HCL (PF) 0.25 % IJ SOLN
INTRAMUSCULAR | Status: DC | PRN
  Administered 2017-05-17: 6 mL

## 2017-05-17 MED ORDER — LIDOCAINE 2% (20 MG/ML) 5 ML SYRINGE
INTRAMUSCULAR | Status: AC
  Filled 2017-05-17: qty 5

## 2017-05-17 MED ORDER — BUPIVACAINE LIPOSOME 1.3 % IJ SUSP
INTRAMUSCULAR | Status: DC | PRN
  Administered 2017-05-17: 20 mL

## 2017-05-17 MED ORDER — BUPIVACAINE HCL (PF) 0.25 % IJ SOLN
INTRAMUSCULAR | Status: AC
  Filled 2017-05-17: qty 30

## 2017-05-17 MED ORDER — MIDAZOLAM HCL 2 MG/2ML IJ SOLN
INTRAMUSCULAR | Status: AC
  Filled 2017-05-17: qty 2

## 2017-05-17 MED ORDER — MEPERIDINE HCL 25 MG/ML IJ SOLN: 6.2500 mg | INTRAMUSCULAR | Status: DC | PRN

## 2017-05-17 MED ORDER — BUPIVACAINE LIPOSOME 1.3 % IJ SUSP
20.0000 mL | Freq: Once | INTRAMUSCULAR | Status: AC
  Filled 2017-05-17: qty 20

## 2017-05-17 MED ORDER — LIDOCAINE-EPINEPHRINE 1 %-1:100000 IJ SOLN
INTRAMUSCULAR | Status: AC
  Filled 2017-05-17: qty 1

## 2017-05-17 MED ORDER — LACTATED RINGERS IV SOLN
INTRAVENOUS | Status: DC
  Administered 2017-05-17: 12:00:00 via INTRAVENOUS

## 2017-05-17 MED ORDER — PHENYLEPHRINE HCL 10 MG/ML IJ SOLN
INTRAVENOUS | Status: DC | PRN
  Administered 2017-05-17: 25 ug/min via INTRAVENOUS

## 2017-05-17 SURGICAL SUPPLY — 74 items
BAG DECANTER FOR FLEXI CONT (MISCELLANEOUS) ×3 IMPLANT
BENZOIN TINCTURE PRP APPL 2/3 (GAUZE/BANDAGES/DRESSINGS) ×3 IMPLANT
BLADE CLIPPER SURG (BLADE) IMPLANT
BLADE SURG 11 STRL SS (BLADE) ×3 IMPLANT
BONE VIVIGEN FORMABLE 10CC (Bone Implant) ×6 IMPLANT
BUR CUTTER 7.0 ROUND (BURR) ×3 IMPLANT
BUR MATCHSTICK NEURO 3.0 LAGG (BURR) ×3 IMPLANT
CANISTER SUCT 3000ML PPV (MISCELLANEOUS) ×3 IMPLANT
CAP LOCKING THREADED (Cap) ×30 IMPLANT
CARTRIDGE OIL MAESTRO DRILL (MISCELLANEOUS) ×1 IMPLANT
CLOSURE WOUND 1/2 X4 (GAUZE/BANDAGES/DRESSINGS) ×1
CONT SPEC 4OZ CLIKSEAL STRL BL (MISCELLANEOUS) ×3 IMPLANT
COVER BACK TABLE 60X90IN (DRAPES) ×3 IMPLANT
DECANTER SPIKE VIAL GLASS SM (MISCELLANEOUS) ×3 IMPLANT
DERMABOND ADVANCED (GAUZE/BANDAGES/DRESSINGS) ×2
DERMABOND ADVANCED .7 DNX12 (GAUZE/BANDAGES/DRESSINGS) ×1 IMPLANT
DIFFUSER DRILL AIR PNEUMATIC (MISCELLANEOUS) ×3 IMPLANT
DRAPE C-ARM 42X72 X-RAY (DRAPES) ×3 IMPLANT
DRAPE C-ARMOR (DRAPES) ×3 IMPLANT
DRAPE HALF SHEET 40X57 (DRAPES) IMPLANT
DRAPE LAPAROTOMY 100X72X124 (DRAPES) ×3 IMPLANT
DRAPE POUCH INSTRU U-SHP 10X18 (DRAPES) ×3 IMPLANT
DRAPE SURG 17X23 STRL (DRAPES) ×3 IMPLANT
DRESSING OPSITE X SMALL 2X3 (GAUZE/BANDAGES/DRESSINGS) ×3 IMPLANT
DRSG OPSITE POSTOP 4X10 (GAUZE/BANDAGES/DRESSINGS) ×3 IMPLANT
DURAPREP 26ML APPLICATOR (WOUND CARE) ×3 IMPLANT
ELECT BLADE 4.0 EZ CLEAN MEGAD (MISCELLANEOUS) ×3
ELECT REM PT RETURN 9FT ADLT (ELECTROSURGICAL) ×3
ELECTRODE BLDE 4.0 EZ CLN MEGD (MISCELLANEOUS) ×1 IMPLANT
ELECTRODE REM PT RTRN 9FT ADLT (ELECTROSURGICAL) ×1 IMPLANT
EVACUATOR 1/8 PVC DRAIN (DRAIN) ×3 IMPLANT
EVACUATOR 3/16  PVC DRAIN (DRAIN)
EVACUATOR 3/16 PVC DRAIN (DRAIN) IMPLANT
FIBER BOAT ALLOFUSE 15CC (Bone Implant) ×6 IMPLANT
GAUZE SPONGE 4X4 12PLY STRL (GAUZE/BANDAGES/DRESSINGS) ×3 IMPLANT
GAUZE SPONGE 4X4 16PLY XRAY LF (GAUZE/BANDAGES/DRESSINGS) IMPLANT
GLOVE BIO SURGEON STRL SZ7 (GLOVE) IMPLANT
GLOVE BIO SURGEON STRL SZ8 (GLOVE) ×6 IMPLANT
GLOVE BIOGEL PI IND STRL 7.0 (GLOVE) IMPLANT
GLOVE BIOGEL PI INDICATOR 7.0 (GLOVE)
GLOVE ECLIPSE 7.5 STRL STRAW (GLOVE) ×9 IMPLANT
GLOVE EXAM NITRILE LRG STRL (GLOVE) IMPLANT
GLOVE EXAM NITRILE XL STR (GLOVE) IMPLANT
GLOVE EXAM NITRILE XS STR PU (GLOVE) IMPLANT
GLOVE INDICATOR 8.5 STRL (GLOVE) ×6 IMPLANT
GOWN STRL REUS W/ TWL LRG LVL3 (GOWN DISPOSABLE) ×1 IMPLANT
GOWN STRL REUS W/ TWL XL LVL3 (GOWN DISPOSABLE) ×1 IMPLANT
GOWN STRL REUS W/TWL 2XL LVL3 (GOWN DISPOSABLE) ×3 IMPLANT
GOWN STRL REUS W/TWL LRG LVL3 (GOWN DISPOSABLE) ×2
GOWN STRL REUS W/TWL XL LVL3 (GOWN DISPOSABLE) ×2
HEMOSTAT POWDER KIT SURGIFOAM (HEMOSTASIS) IMPLANT
KIT BASIN OR (CUSTOM PROCEDURE TRAY) ×3 IMPLANT
KIT ROOM TURNOVER OR (KITS) ×3 IMPLANT
MILL MEDIUM DISP (BLADE) IMPLANT
NEEDLE HYPO 25X1 1.5 SAFETY (NEEDLE) ×3 IMPLANT
NS IRRIG 1000ML POUR BTL (IV SOLUTION) ×3 IMPLANT
OIL CARTRIDGE MAESTRO DRILL (MISCELLANEOUS) ×3
PACK LAMINECTOMY NEURO (CUSTOM PROCEDURE TRAY) ×3 IMPLANT
PAD ARMBOARD 7.5X6 YLW CONV (MISCELLANEOUS) ×9 IMPLANT
ROD SPINAL 5.5X400 HEX CREO (Rod) ×3 IMPLANT
SCREW CREO 5.5X40 (Screw) ×24 IMPLANT
SCREW CREO MOD AMP 5.5X45MM (Screw) ×6 IMPLANT
SCREW PA THRD CREO TULIP 5.5X4 (Head) ×30 IMPLANT
SPONGE LAP 4X18 X RAY DECT (DISPOSABLE) IMPLANT
SPONGE SURGIFOAM ABS GEL 100 (HEMOSTASIS) ×3 IMPLANT
STRIP CLOSURE SKIN 1/2X4 (GAUZE/BANDAGES/DRESSINGS) ×2 IMPLANT
SUT VIC AB 0 CT1 18XCR BRD8 (SUTURE) ×2 IMPLANT
SUT VIC AB 0 CT1 8-18 (SUTURE) ×4
SUT VIC AB 2-0 CT1 18 (SUTURE) ×3 IMPLANT
SUT VIC AB 4-0 PS2 27 (SUTURE) ×3 IMPLANT
TOWEL GREEN STERILE (TOWEL DISPOSABLE) ×3 IMPLANT
TOWEL GREEN STERILE FF (TOWEL DISPOSABLE) ×3 IMPLANT
TRAY FOLEY W/METER SILVER 16FR (SET/KITS/TRAYS/PACK) IMPLANT
WATER STERILE IRR 1000ML POUR (IV SOLUTION) ×3 IMPLANT
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
## 2017-05-17 NOTE — Progress Notes (Signed)
Orthopedic Tech Progress Note Patient Details:  Emily Brewer 25-Jun-1945 [id number] Called [employer] for brace. Patient ID: Emily Brewer, female   DOB: Feb 23, 1946, 72 y.o.   MRN: [id number]   Braulio Bosch 05/17/2017, 5:58 PM

## 2017-05-17 NOTE — Progress Notes (Signed)
Orthopedic Tech Progress Note Patient Details:  Emily Brewer [birth year] [id number] [employer] completed brace order. Patient ID: Emily Brewer, female   DOB: [birth year], 72 y.o.   MRN: [id number]   Emily Brewer 05/17/2017, 9:40 PM
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
## 2017-05-17 NOTE — Transfer of Care (Signed)
Immediate Anesthesia Transfer of Care Note  Patient: Emily Brewer  Procedure(s) Performed: Spinal stabilization Thoracic ten or Thoracic eleven to lumbar three with pedicle screw fixation (No Interbodies) (N/A Back)  Patient Location: PACU  Anesthesia Type:General  Level of Consciousness: awake, alert  and oriented  Airway & Oxygen Therapy: Patient Spontanous Breathing and Patient connected to face mask oxygen  Post-op Assessment: Report given to RN, Post -op Vital signs reviewed and stable and Patient moving all extremities X 4  Post vital signs: Reviewed and stable  Last Vitals:  Vitals:   05/17/17 1132 05/17/17 1533  BP: (!) 103/42 102/62  Pulse: 71 72  Resp: 14 15  Temp: 36.7 C (!) 36.2 C  SpO2: 98% 92%    Last Pain:  Vitals:   05/17/17 1132  TempSrc: Oral  PainSc:       Patients Stated Pain Goal: 0 (39/68/86 [id number])  Complications: No apparent anesthesia complications

## 2017-05-17 NOTE — Progress Notes (Signed)
Patient was able to pass hard stool and had good success with fleets enema. Patient stated that she does not want to do the soap suds enema because she if feeling relief now. Will continue to monitor.

## 2017-05-17 NOTE — Anesthesia Postprocedure Evaluation (Signed)
Anesthesia Post Note  Patient: Marvetta Vohs  Procedure(s) Performed: OPEN REDUCTION INTERNAL FIXATION (ORIF) FOOT LISFRANC FRACTURE (Left Foot)     Patient location during evaluation: PACU Anesthesia Type: General and Regional Level of consciousness: awake and alert Pain management: pain level controlled Vital Signs Assessment: post-procedure vital signs reviewed and stable Respiratory status: spontaneous breathing, nonlabored ventilation, respiratory function stable and patient connected to nasal cannula oxygen Cardiovascular status: blood pressure returned to baseline and stable Postop Assessment: no apparent nausea or vomiting Anesthetic complications: no    Last Vitals:  Vitals:   05/17/17 1719 05/17/17 1737  BP: (!) 119/48 (!) 114/41  Pulse:  77  Resp:  20  Temp:  36.8 C  SpO2:  94%    Last Pain:  Vitals:   05/17/17 1737  TempSrc: Oral  PainSc: 4                  Dequante Tremaine

## 2017-05-17 NOTE — Progress Notes (Signed)
Patient ID: Emily Brewer, female   DOB: 05-28-1945, 72 y.o.   MRN: [id number] Patient is doing well this morning condition back pain but no lower 70 symptoms.  Neurologically stable With intact motor and sensory exam lower extremities bilaterally  I've extensively gone over the risks and benefits of the operation with the patient as well as her husband for a T11 to L3 spinal stabilization. I reviewed the risks benefits perioperative course expectations of outcome and alternatives of surgery and they understand and agree to proceed forward.
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
## 2017-05-17 NOTE — Anesthesia Procedure Notes (Signed)
Procedure Name: Intubation Date/Time: 05/17/2017 12:52 PM Performed by: Kyung Rudd, CRNA Pre-anesthesia Checklist: Patient identified, Emergency Drugs available, Suction available and Patient being monitored Patient Re-evaluated:Patient Re-evaluated prior to induction Oxygen Delivery Method: Circle system utilized Preoxygenation: Pre-oxygenation with 100% oxygen Induction Type: IV induction Ventilation: Mask ventilation without difficulty Laryngoscope Size: Mac and 3 Grade View: Grade I Tube type: Oral Tube size: 7.0 mm Number of attempts: 1 Airway Equipment and Method: Stylet Placement Confirmation: ETT inserted through vocal cords under direct vision,  positive ETCO2 and breath sounds checked- equal and bilateral Secured at: 21 cm Tube secured with: Tape Dental Injury: Teeth and Oropharynx as per pre-operative assessment

## 2017-05-17 NOTE — Anesthesia Preprocedure Evaluation (Signed)
Anesthesia Evaluation  Patient identified by MRN, date of birth, ID band Patient awake    Reviewed: Allergy & Precautions, NPO status , Patient's Chart, lab work & pertinent test results, reviewed documented beta blocker date and time   History of Anesthesia Complications Negative for: history of anesthetic complications  Airway Mallampati: II  TM Distance: >3 FB Neck ROM: Full    Dental  (+) Teeth Intact   Pulmonary neg shortness of breath, neg sleep apnea, neg COPD, neg recent URI, former smoker,    breath sounds clear to auscultation       Cardiovascular hypertension, Pt. on medications and Pt. on home beta blockers (-) angina(-) Past MI and (-) CHF  Rhythm:Regular     Neuro/Psych PSYCHIATRIC DISORDERS Anxiety negative neurological ROS     GI/Hepatic negative GI ROS, Neg liver ROS,   Endo/Other  negative endocrine ROS  Renal/GU negative Renal ROS     Musculoskeletal negative musculoskeletal ROS (+)   Abdominal   Peds  Hematology negative hematology ROS (+)   Anesthesia Other Findings   Reproductive/Obstetrics                             Anesthesia Physical  Anesthesia Plan  ASA: II  Anesthesia Plan: General   Post-op Pain Management:    Induction: Intravenous  PONV Risk Score and Plan: 3 and Ondansetron, Dexamethasone and Treatment may vary due to age or medical condition  Airway Management Planned: Oral ETT  Additional Equipment: None  Intra-op Plan:   Post-operative Plan: Extubation in OR  Informed Consent: I have reviewed the patients History and Physical, chart, labs and discussed the procedure including the risks, benefits and alternatives for the proposed anesthesia with the patient or authorized representative who has indicated his/her understanding and acceptance.   Dental advisory given  Plan Discussed with: CRNA, Surgeon and Anesthesiologist  Anesthesia Plan  Comments:         Anesthesia Quick Evaluation

## 2017-05-17 NOTE — Anesthesia Postprocedure Evaluation (Signed)
Anesthesia Post Note  Patient: Emily Brewer  Procedure(s) Performed: Spinal stabilization Thoracic ten or Thoracic eleven to lumbar three with pedicle screw fixation (No Interbodies) (N/A Back)     Patient location during evaluation: PACU Anesthesia Type: General Level of consciousness: awake and alert Pain management: pain level controlled Vital Signs Assessment: post-procedure vital signs reviewed and stable Respiratory status: spontaneous breathing, nonlabored ventilation, respiratory function stable and patient connected to nasal cannula oxygen Cardiovascular status: blood pressure returned to baseline and stable Postop Assessment: no apparent nausea or vomiting Anesthetic complications: no    Last Vitals:  Vitals:   05/17/17 1132 05/17/17 1533  BP: (!) 103/42 102/62  Pulse: 71 72  Resp: 14 15  Temp: 36.7 C (!) 36.2 C  SpO2: 98% 92%    Last Pain:  Vitals:   05/17/17 1132  TempSrc: Oral  PainSc:                  Azrael Maddix

## 2017-05-17 NOTE — Progress Notes (Signed)
Patient asked to speak to the surgeon before signing consent form. She said that she still wants to have the procedure done; however, she wants to ask some questions before proceeding.

## 2017-05-18 ENCOUNTER — Inpatient Hospital Stay (HOSPITAL_COMMUNITY): Payer: Medicare Other

## 2017-05-18 MED ORDER — TRAMADOL HCL 50 MG PO TABS
50.0000 mg | ORAL_TABLET | Freq: Four times a day (QID) | ORAL | Status: DC
  Administered 2017-05-18 – 2017-05-22 (×15): 50 mg via ORAL
  Filled 2017-05-18 (×15): qty 1

## 2017-05-18 MED FILL — Thrombin For Soln 20000 Unit: CUTANEOUS | Qty: 1 | Status: AC

## 2017-05-18 NOTE — Progress Notes (Addendum)
Patient ID: Emily Brewer, female   DOB: 1946-03-03, 72 y.o.   MRN: [id number] Patient doing well condition of back pain no new lower extremity symptoms.  Strength 5 out of 5 incisions clean dry and intact  Okay from my perspective to mobilize the patient. She does not need to wear the brace when she is in bed only when she is out of bed and she may don  it sitting on the side of the bed.
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
## 2017-05-19 LAB — POCT I-STAT 4, (NA,K, GLUC, HGB,HCT)
GLUCOSE: 122 mg/dL — AB (ref 65–99)
HEMATOCRIT: 27 % — AB (ref 36.0–46.0)
HEMOGLOBIN: 9.2 g/dL — AB (ref 12.0–15.0)
Potassium: 3.7 mmol/L (ref 3.5–5.1)
Sodium: 140 mmol/L (ref 135–145)

## 2017-05-19 MED ORDER — ENOXAPARIN SODIUM 40 MG/0.4ML ~~LOC~~ SOLN
40.0000 mg | SUBCUTANEOUS | Status: DC
  Administered 2017-05-19 – 2017-05-22 (×4): 40 mg via SUBCUTANEOUS
  Filled 2017-05-19 (×4): qty 0.4
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
## 2017-05-19 NOTE — Progress Notes (Signed)
Patient ID: Emily Brewer, female   DOB: November 23, 1945, 72 y.o.   MRN: [id number] Patient doing well condition of back pain no leg pain no new numbness or tingling.  Strength out of 5 incision clean dry and intact  Continue to mobilize with physical occupational therapy in her brace. Her Hemovac and be discontinued when the output split down less than 50 mL over 8 hour shift.
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
## 2017-05-20 NOTE — Progress Notes (Signed)
Patient ID: Emily Brewer, female   DOB: [birth year], 72 y.o.   MRN: [id number] Pain controlled, no leg pain, moves legs well in bed, mobilize in brace
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
## 2017-05-21 NOTE — Progress Notes (Signed)
1900: Handoff report received from RN. Pt resting comfortably. Discussed plan of care for the shift with pt. Pt amenable to plan.  0000: Pt resting comfortably.  0400: Pt continues resting comfortably.  0700: Handoff report given to RN. No acute events overnight.

## 2017-05-21 NOTE — Progress Notes (Signed)
Neurosurgery Progress Note  No issues overnight.  Pain well controlled No concerns this am  EXAM:  BP (!) 127/54   Pulse 91   Temp 98.1 F (36.7 C) (Oral)   Resp 18   Ht 5\' 5"  (1.651 m)   Wt 63.2 kg (139 lb 5.3 oz)   SpO2 98%   BMI 23.19 kg/m   Awake, alert, oriented  Speech fluent, appropriate  MAEW with exception of decreased movement LLE secondary to ORIF  PLAN Stable from NS perspective Continue to work with therapy

## 2017-05-21 NOTE — Progress Notes (Signed)
1900: Handoff report received from RN. Pt resting comfortably. Discussed plan of care for the shift with pt; amenable to plan.  0000: Pt resting comfortably.  0400: Pt continues resting comfortably.  0700: Handoff report given to RN. No acute events overnight.

## 2017-05-22 LAB — CBC
HCT: 30.7 % — ABNORMAL LOW (ref 36.0–46.0)
Hemoglobin: 9.6 g/dL — ABNORMAL LOW (ref 12.0–15.0)
MCH: 30.3 pg (ref 26.0–34.0)
MCHC: 31.3 g/dL (ref 30.0–36.0)
MCV: 96.8 fL (ref 78.0–100.0)
PLATELETS: 721 10*3/uL — AB (ref 150–400)
RBC: 3.17 MIL/uL — AB (ref 3.87–5.11)
RDW: 16.5 % — ABNORMAL HIGH (ref 11.5–15.5)
WBC: 9.9 10*3/uL (ref 4.0–10.5)

## 2017-05-22 MED ORDER — GABAPENTIN 300 MG PO CAPS: 300.0000 mg | ORAL_CAPSULE | Freq: Three times a day (TID) | ORAL | 0 refills | Status: AC

## 2017-05-22 MED ORDER — TRAMADOL HCL 50 MG PO TABS: 50.0000 mg | ORAL_TABLET | Freq: Four times a day (QID) | ORAL | 1 refills | Status: AC | PRN
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
## 2017-05-22 NOTE — Progress Notes (Signed)
NEUROSURGERY PROGRESS NOTE  Doing well. Complains of appropriate back soreness. Ambulating and voiding well Good strength and sensation Incision CDI  Temp:  [97.8 F (36.6 C)-98.3 F (36.8 C)] 98.1 F (36.7 C) (01/28 0900) Resp:  [14-16] 16 (01/28 0400) BP: (98-120)/(50-62) 114/57 (01/28 0400) SpO2:  [95 %-100 %] 95 % (01/28 0400)  Plan: Patient is being discharged today. Follow up with [facility] in two weeks. Continue brace when up. D/C lumbar drain.  Eleonore Chiquito, NP 05/22/2017 1:40 PM
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
## 2017-05-22 NOTE — Progress Notes (Signed)
Pt being discharged home via wheelchair with family. Pt alert and oriented x4. VSS. Pt c/o no pain at this time. No signs of respiratory distress. Education complete and care plans resolved. IV removed with catheter intact and pt tolerated well. No further issues at this time. Pt to follow up with PCP. Krystale Rinkenberger R, RN 
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
## 2017-10-31 ENCOUNTER — Encounter (HOSPITAL_COMMUNITY): Payer: Self-pay | Admitting: Neurosurgery
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
